# Patient Record
Sex: Male | Born: 1990 | Race: White | Marital: Single
Health system: Southern US, Community
[De-identification: ages and names within clinical notes are randomized; demographics above are authoritative.]

---

## 2014-03-28 ENCOUNTER — Ambulatory Visit
Admission: RE | Admit: 2014-03-28 | Discharge: 2014-03-28 | Disposition: A | Discharge status: Home / Self Care | Payer: 59 | Source: Ambulatory Visit | Attending: Chiropractic Medicine | Admitting: Chiropractic Medicine

## 2014-03-28 ENCOUNTER — Other Ambulatory Visit: Payer: Self-pay | Admitting: Chiropractic Medicine

## 2014-03-28 DIAGNOSIS — R52 Pain, unspecified: Secondary | ICD-10-CM

## 2014-03-28 DIAGNOSIS — T1490XA Injury, unspecified, initial encounter: Secondary | ICD-10-CM

## 2015-09-07 IMAGING — CR DG LUMBAR SPINE 2-3V
3 series · 3 of 3 positions shown · non-contrast
Comparison: None

CLINICAL DATA: Status post recent fall directly onto the back with
persistent low back pain in the L3-4 region

EXAM:
LUMBAR SPINE - 2-3 VIEW

[view not recorded (1 of 3)]
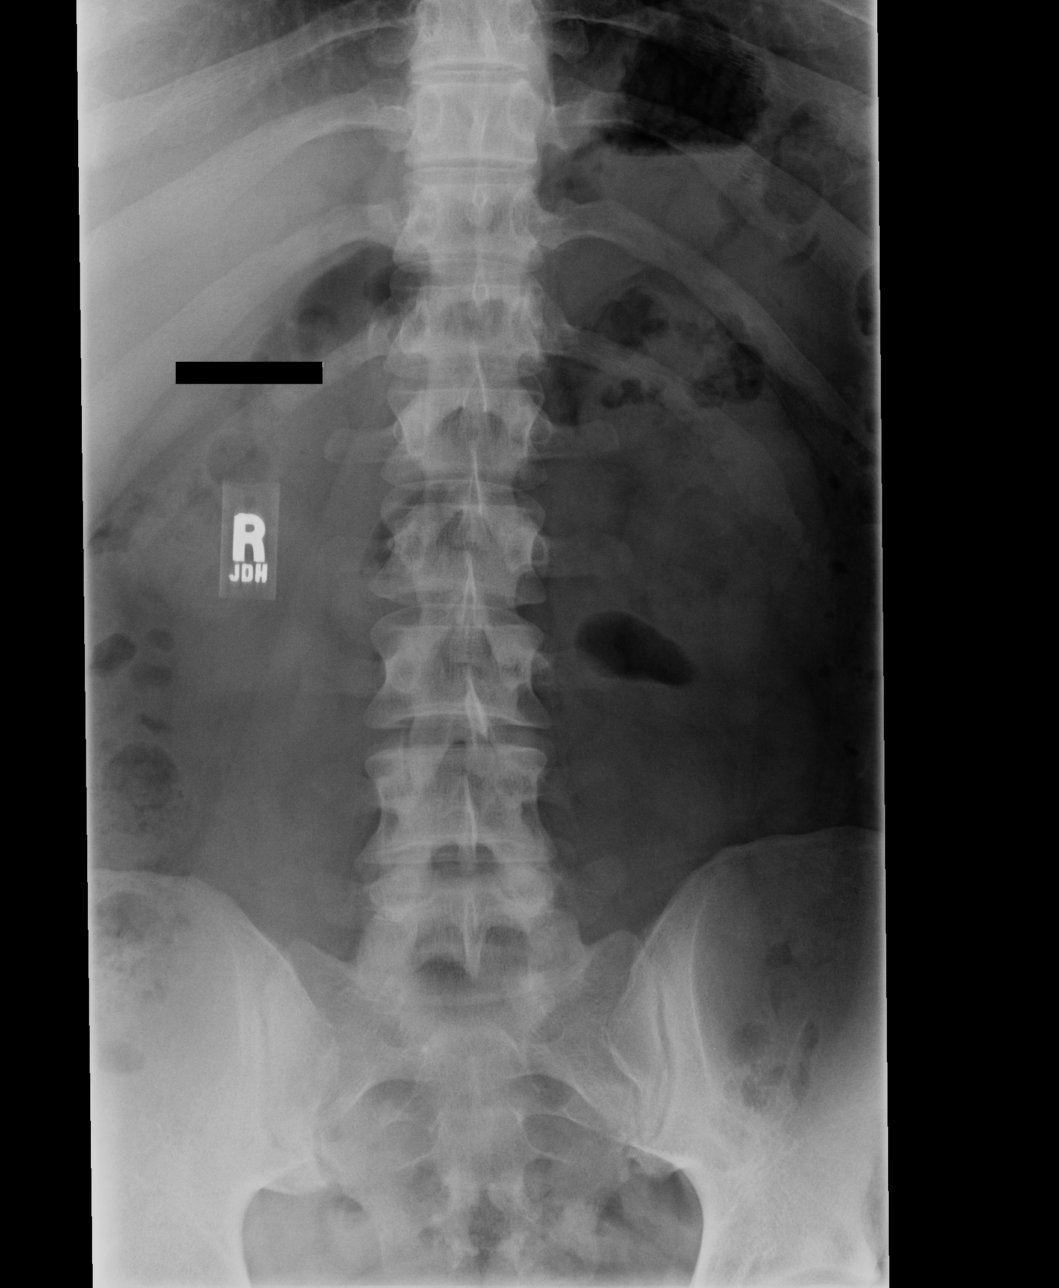

[view not recorded (2 of 3)]
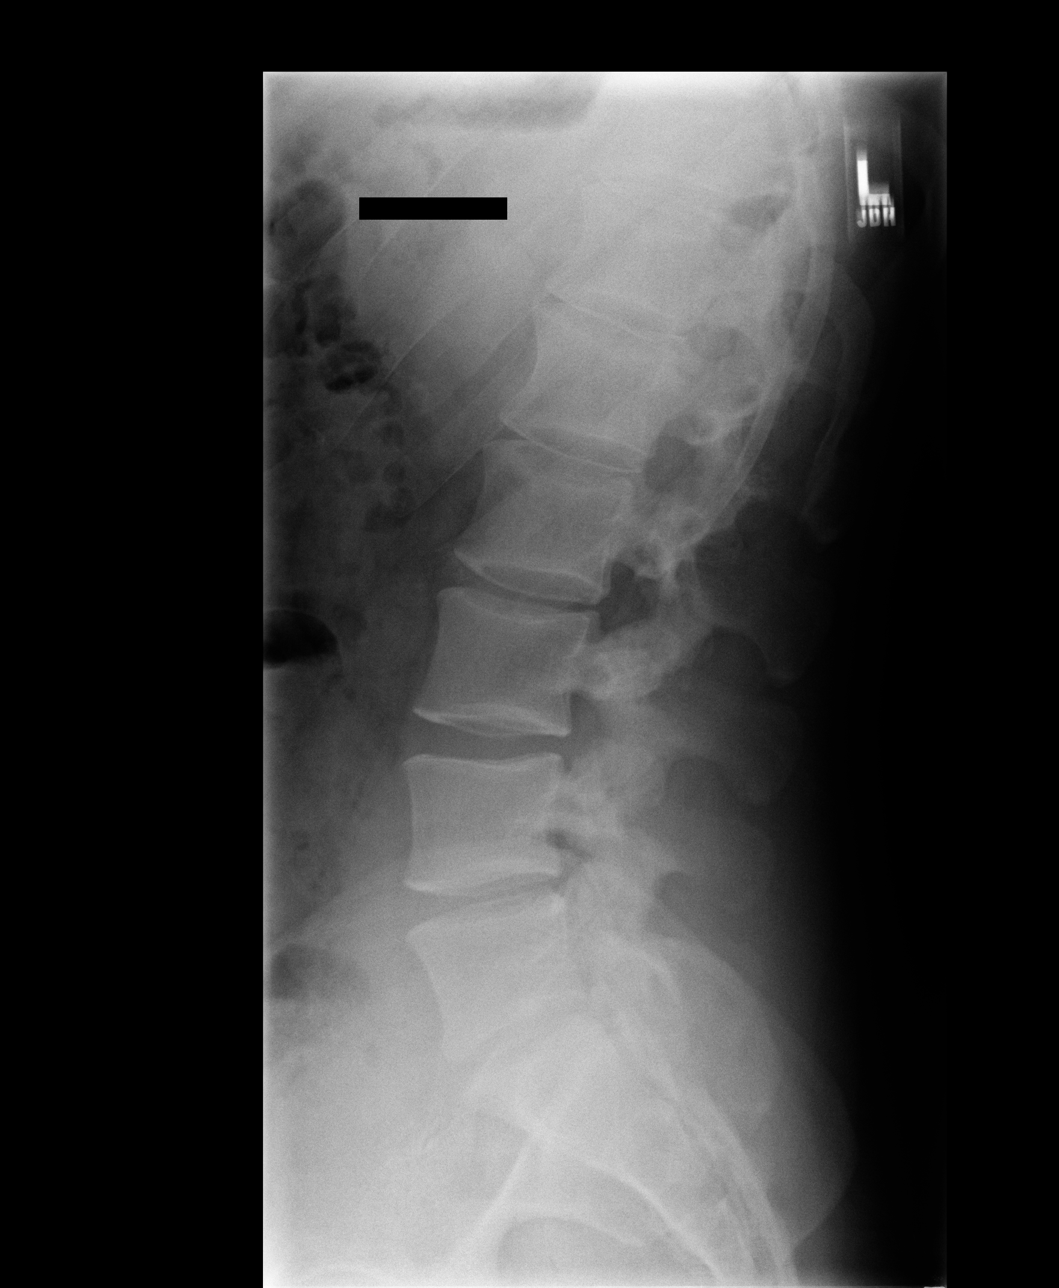

[view not recorded (3 of 3)]
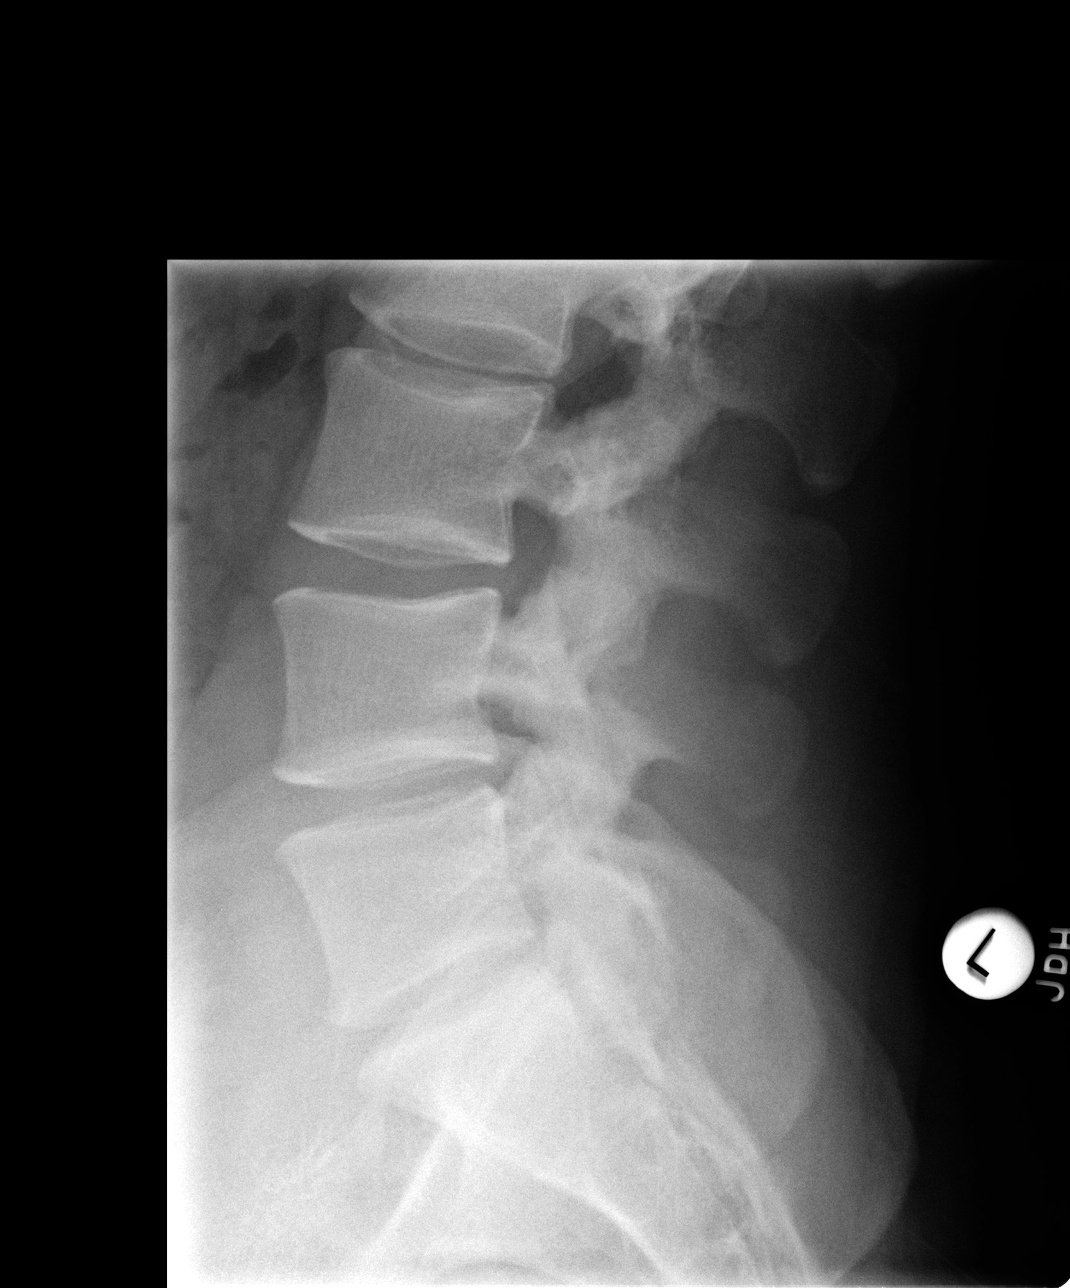

[3 of 3 positions shown; findings below may reference images not displayed]

FINDINGS: The lumbar vertebral bodies are preserved in height. There is mild
disc space narrowing at L4-5. The spinous processes are intact.
There is no spondylolisthesis. The pedicles and transverse processes
are normal. The observed portions of the sacrum are normal.
IMPRESSION: There is no evidence of a compression fracture nor spinous process
fracture. There is mild disc space narrowing at L4-5.
